# Patient Record
Sex: Male | Born: 1965 | Race: White | Hispanic: No
Health system: Southern US, Community
[De-identification: ages and names within clinical notes are randomized; demographics above are authoritative.]

---

## 2006-02-08 ENCOUNTER — Encounter: Admission: RE | Admit: 2006-02-08 | Discharge: 2006-02-08 | Payer: Self-pay | Admitting: Specialist

## 2008-04-23 ENCOUNTER — Ambulatory Visit (HOSPITAL_BASED_OUTPATIENT_CLINIC_OR_DEPARTMENT_OTHER): Admission: RE | Admit: 2008-04-23 | Discharge: 2008-04-23 | Payer: Self-pay | Admitting: Specialist

## 2008-04-27 IMAGING — US US EXTREM LOW VENOUS*L*
1 series · 14 of 24 positions shown · non-contrast
Comparison: None.

CLINICAL DATA: Left knee pain and swelling post arthroscopy.
 LEFT LOWER EXTREMITY VENOUS IMAGING:
TECHNIQUE: Gray scale sonography with compression, as well as color and duplex Doppler ultrasound, were performed to evaluate the deep venous system from the level of the common femoral vein through the popliteal and proximal calf veins.

[Series 1: unknown · 14 of 26 slices shown]
[im 1/26]
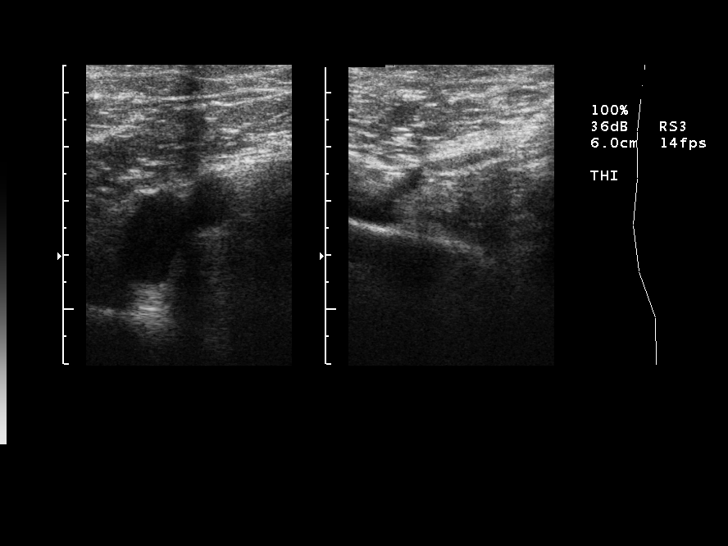
[im 3/26]
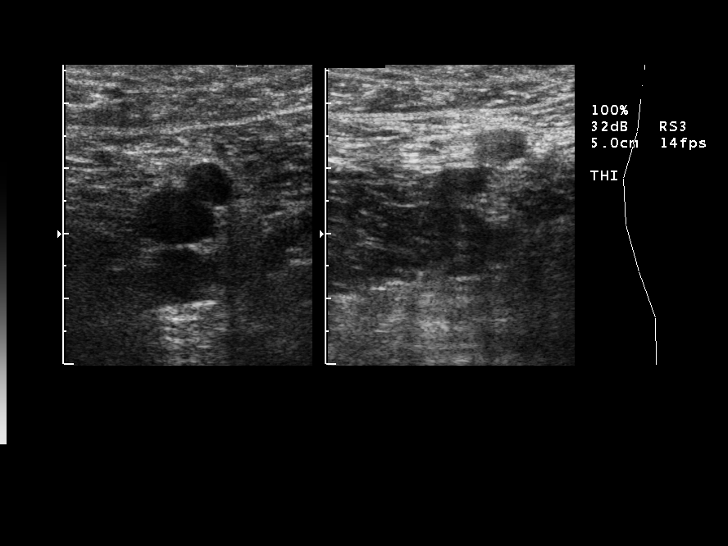
[im 5/26]
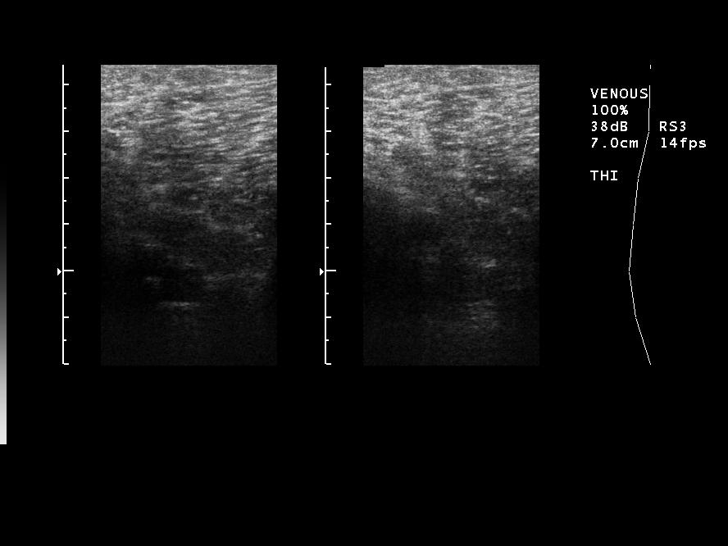
[im 7/26]
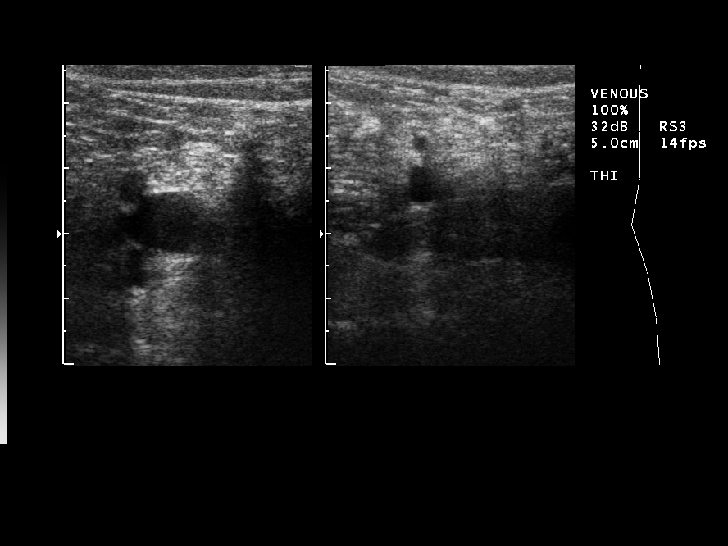
[im 8/26]
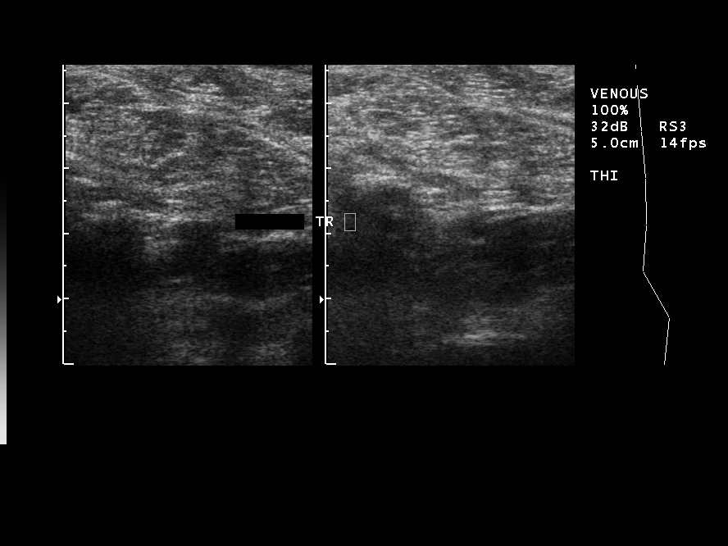
[im 10/26]
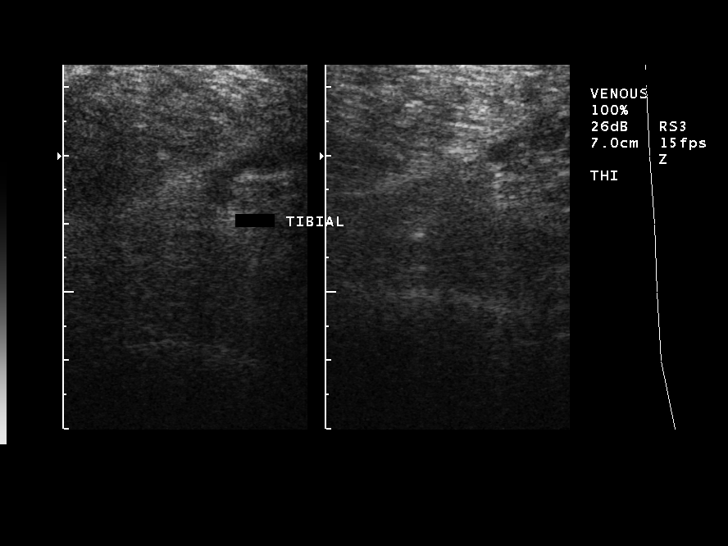
[im 12/26]
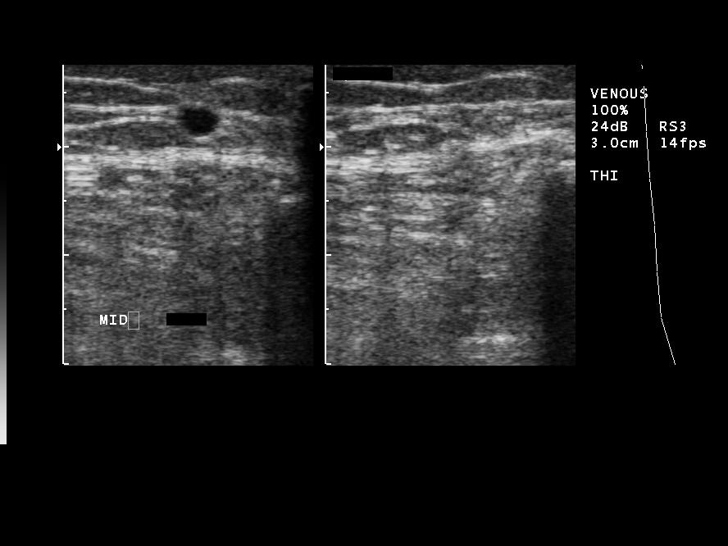
[im 14/26]
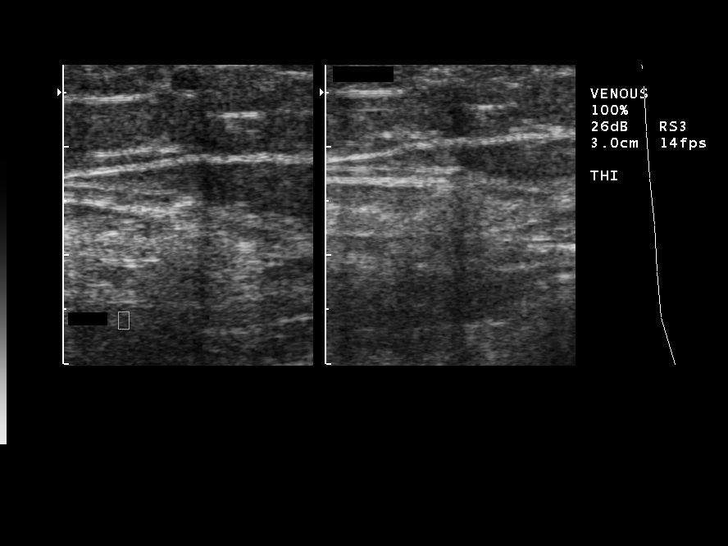
[im 16/26]
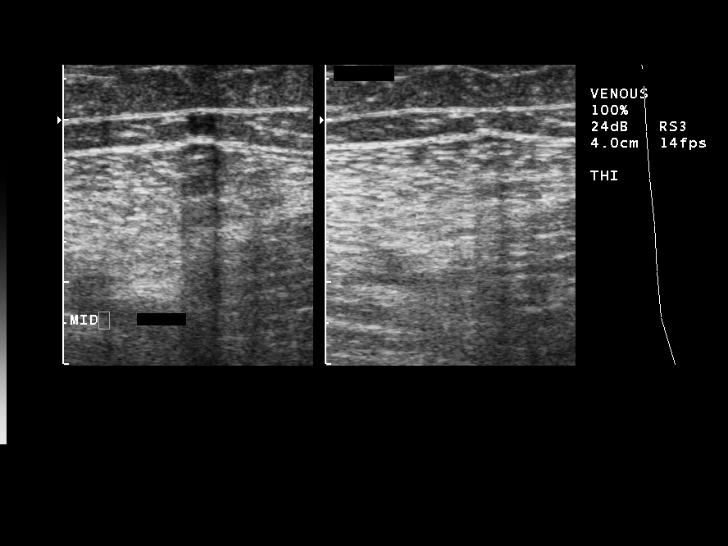
[im 18/26]
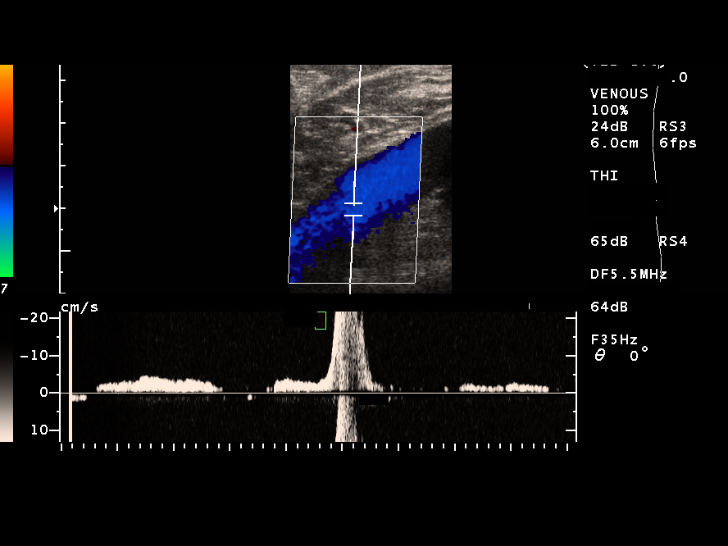
[im 20/26]
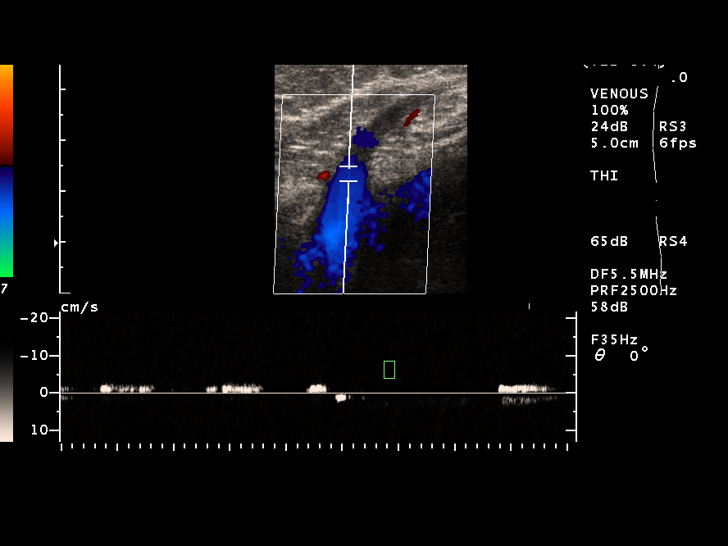
[im 21/26]
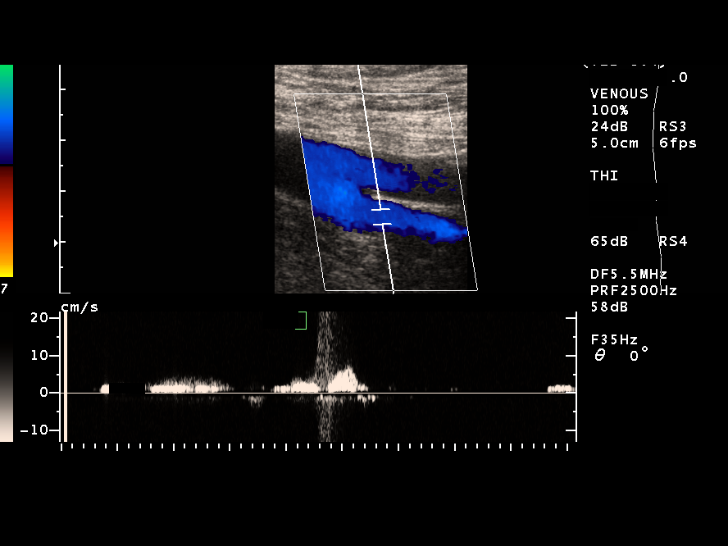
[im 23/26]
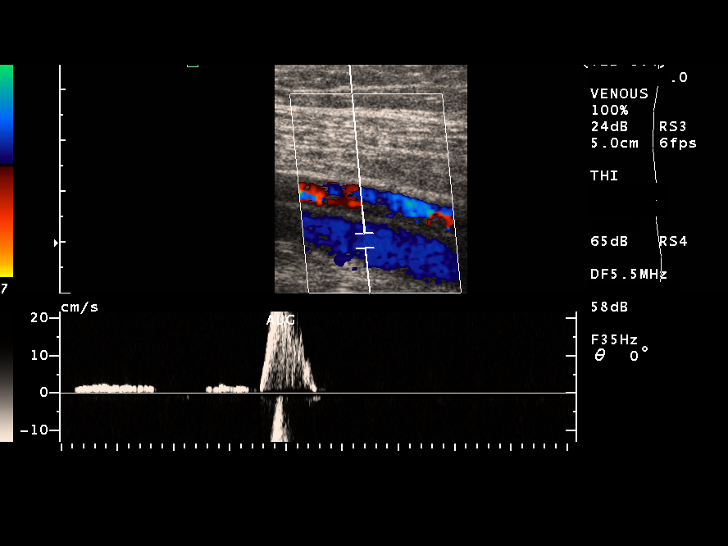
[im 26/26]
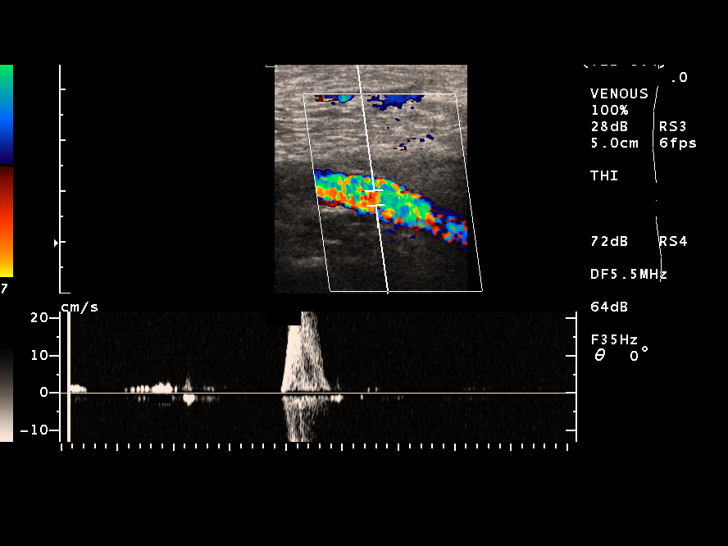

[14 of 24 positions shown; findings below may reference images not displayed]

FINDINGS: The left lower extremity venous system was examined from the groin to the upper calf.   None of the deep veins show evidence for acute thrombosis.  Normal compressibility, phasicity and augmentation is demonstrated.  There is no evidence for clot in the greater saphenous vein either.
IMPRESSION: No sonographic evidence for deep venous thrombosis of the left lower extremity.

## 2010-04-30 LAB — POCT HEMOGLOBIN-HEMACUE: Hemoglobin: 15.5 g/dL (ref 13.0–17.0)

## 2010-06-03 NOTE — Op Note (Signed)
NAME:  Benjamin Barajas, Benjamin Barajas NO.:  [id number]   MEDICAL RECORD NO.:  [id number]          PATIENT TYPE:  AMB   LOCATION:  [facility]                         FACILITY:  [facility]   PHYSICIAN:  Jene Every, M.D.    DATE OF BIRTH:  1965/11/28   DATE OF PROCEDURE:  04/23/2008  DATE OF DISCHARGE:                               OPERATIVE REPORT   PREOPERATIVE DIAGNOSIS:  Post-traumatic chondromalacia of the left knee.   POSTOPERATIVE DIAGNOSES:  1. Post-traumatic chondromalacia of the left knee.  2. Anterior cruciate ligament partial tear.  3. Lateral meniscus partial tear.   PROCEDURE PERFORMED:  Left knee arthroscopy, chondroplasty of the  patella and the femoral sulcus, debridement of anterior cruciate  ligament and lateral meniscus.   BRIEF HISTORY:  A 45 year old status post knee injury, fell, contusion  of the knee.  He had persistent retropatellar pain in the knee and was  indicated for arthroscopic evaluation and debridement.  The risks and  benefits discussed, including bleeding, infection, DVT, no change in  symptoms, worsening symptoms, need for repeat debridement, etc.   DESCRIPTION OF PROCEDURE:  With the patient in the supine position after  induction of adequate general anesthesia, 1 gram of Kefzol, the left  lower extremity was prepped and draped in the usual sterile fashion.  A  lateral parapatellar portal and superomedial parapatellar was fashioned  with a #11 blade.  Ingress cannula atraumatically placed.  Irrigant was  utilized to insufflate the joint.  Under direct visualization, a medial  parapatellar portal was fashioned with a #11 blade after localization  with an 18 gauge needle, sparing the medial meniscus.  There was grade 3  changes of the femoral sulcus.  Some minor grade 3 changes of the  patella with normal patellofemoral tracking.  A shaver was introduced  and utilized to perform a chondroplasty of the sulcus.  There was loose  flap of debris noted.   Some cartilaginous debris in the joint.  This was  evacuated.  Partial fraying of the ACL was noted to on the anterolateral  bundle, however, under anesthesia the knee was stable.  I performed an  anterior Drawer and a good attachment to the notch wall.  Perhaps 20% of  the tendon was involved.  There was also a tear on the posterior horn of  the lateral meniscus and this was shaved to a stable base as well.  The  remainder of the lateral meniscus, femoral condyle and tibial plateau  was remarkable only for some minor grade 2 of the tibial plateau.   Medially, there was normal femoral condyle, tibial plateau and meniscus  stable to probe palpation without evidence of tear.  The gutters were  unremarkable, as well there was no plica.  The knee was copiously  lavaged.  The PCL was unremarkable as well.   All instrumentation was removed.  The portals were closed with 4-0 nylon  simple sutures.  Quarter-percent Marcaine with epinephrine was  infiltrated in the joint.  The wound was dressed sterilely.  He was  awakened without difficulty and transported to the recovery room  in  satisfactory addition.   The patient tolerated the procedure well with no complications.      Jene Every, M.D.  Electronically Signed     JB/MEDQ  D:  04/23/2008  T:  04/23/2008  Job:  [id number]

## 2019-10-27 ENCOUNTER — Encounter: Payer: Self-pay | Admitting: Podiatry

## 2019-10-27 ENCOUNTER — Other Ambulatory Visit: Payer: Self-pay

## 2019-10-27 ENCOUNTER — Ambulatory Visit (INDEPENDENT_AMBULATORY_CARE_PROVIDER_SITE_OTHER): Payer: 59

## 2019-10-27 ENCOUNTER — Ambulatory Visit (INDEPENDENT_AMBULATORY_CARE_PROVIDER_SITE_OTHER): Payer: 59 | Admitting: Podiatry

## 2019-10-27 DIAGNOSIS — M779 Enthesopathy, unspecified: Secondary | ICD-10-CM

## 2019-10-27 DIAGNOSIS — M79672 Pain in left foot: Secondary | ICD-10-CM

## 2019-10-27 DIAGNOSIS — S99922A Unspecified injury of left foot, initial encounter: Secondary | ICD-10-CM

## 2019-10-27 DIAGNOSIS — M778 Other enthesopathies, not elsewhere classified: Secondary | ICD-10-CM

## 2019-10-27 DIAGNOSIS — M25475 Effusion, left foot: Secondary | ICD-10-CM

## 2019-10-27 NOTE — Progress Notes (Signed)
Subjective:   Patient ID: Benjamin Barajas, male   DOB: 54 y.o.   MRN: [id number]   HPI 54 year old male presents today for same-day appointment given left foot pain pointing the fifth metatarsal base.  He states that it started yesterday is been getting worse.  Denies any recent injury or trauma.  He has no history of gout.  He said no recent treatment.  He has a history of plantar fasciitis and EPF surgery.  No radiating pain or weakness.  He has no other concerns today.   Review of Systems  All other systems reviewed and are negative.  History reviewed. No pertinent past medical history.  History reviewed. No pertinent surgical history.  No current outpatient medications on file.  No Known Allergies       Objective:  Physical Exam  General: AAO x3, NAD  Dermatological: Skin is warm, dry and supple bilateral.  There are no open sores, no preulcerative lesions, no rash or signs of infection present.  Vascular: Dorsalis Pedis artery and Posterior Tibial artery pedal pulses are 2/4 bilateral with immedate capillary fill time. There is no pain with calf compression, swelling, warmth, erythema.   Neruologic: Grossly intact via light touch bilateral.   Musculoskeletal: There is tenderness of the fifth metatarsal base on the distal portion of the peroneal tendon as well as laterally the fifth metatarsal base.  There is localized edema to this area there is no erythema or warmth.  Peroneal tendon appears to be intact.  No other areas of discomfort identified today.  Muscular strength 5/5 in all groups tested bilateral.  Gait: Unassisted, Nonantalgic.       Assessment:   Fifth metatarsal base pain, insertional peroneal tendinitis left foot.    Plan:  -Treatment options discussed including all alternatives, risks, and complications -Etiology of symptoms were discussed -X-rays were obtained and reviewed with the patient.  There is no evidence of acute fracture or stress fracture.   Awaiting radiology report. -Steroid injection performed.  Skin was closed alcohol and a mixture of half cc Marcaine plain, half cc of lidocaine plain was infiltrated on the fifth metatarsal base with care if not correctly infiltrated into the peroneal tendon. -Cam boot was discussed.  He continues Percocet as needed for the start to feel better weightbearing in the cam boot. -Voltaren gel.  Anti-inflammatories as needed. -If no improvement next couple days will do Medrol Dosepak  Vivi Barrack DPM

## 2019-10-30 ENCOUNTER — Ambulatory Visit: Payer: Self-pay | Admitting: Podiatry

## 2019-11-10 ENCOUNTER — Ambulatory Visit: Payer: Self-pay | Admitting: Podiatry

## 2022-01-13 IMAGING — DX DG FOOT COMPLETE 3+V*L*
3 series · 3 of 3 positions shown · non-contrast
Comparison: None.

CLINICAL DATA: Acute lateral left foot pain. Unable to bear weight.
No injury.

EXAM:
LEFT FOOT - COMPLETE 3+ VIEW

[foot ap]
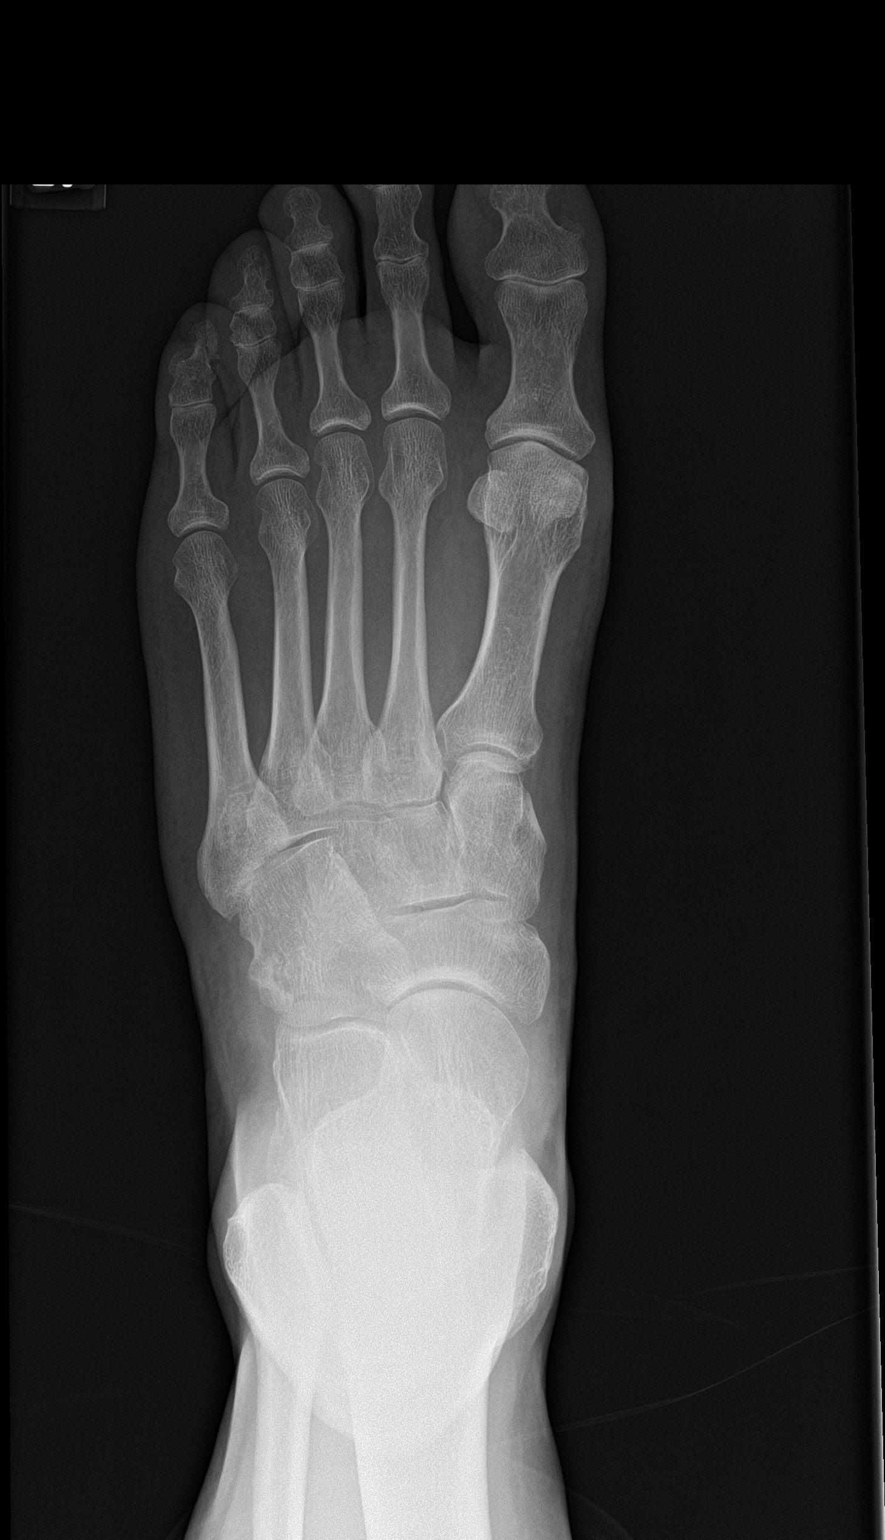

[foot obl]
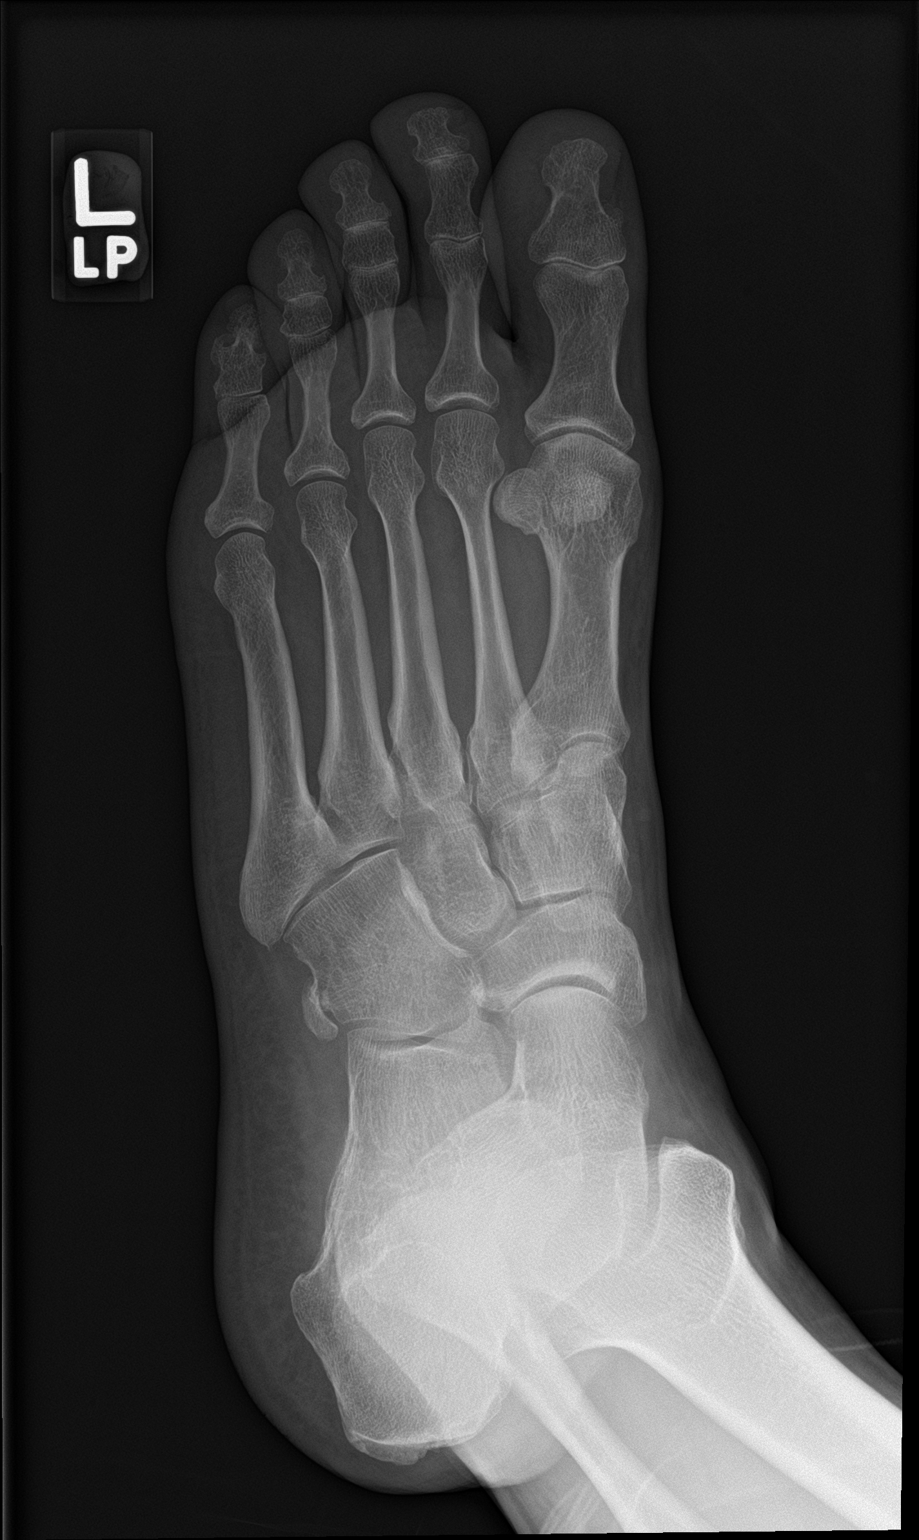

[foot lat]
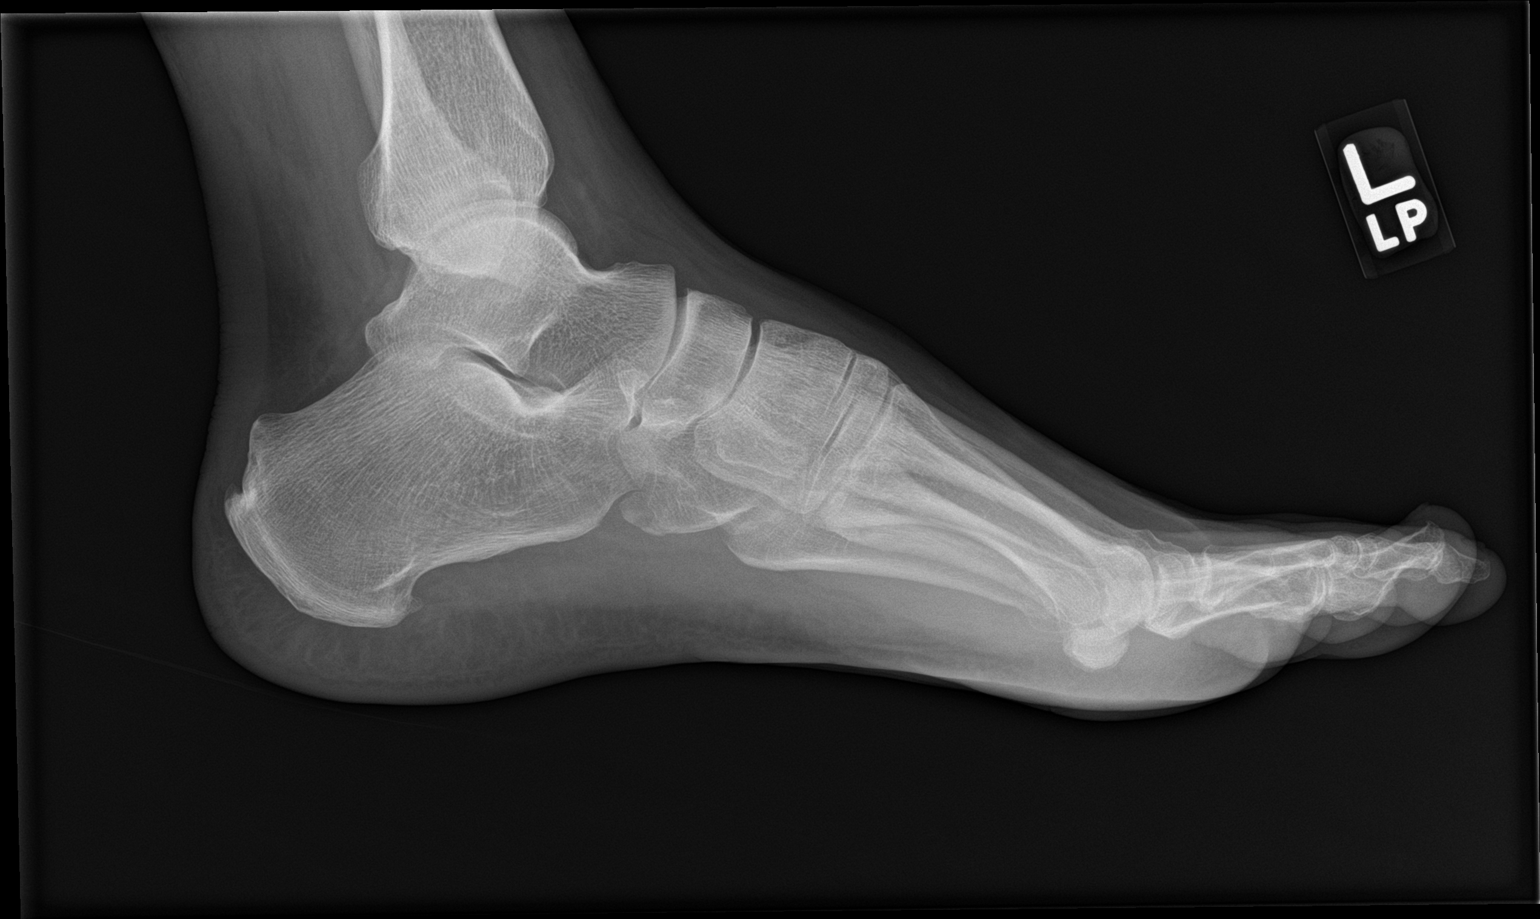

[3 of 3 positions shown; findings below may reference images not displayed]

FINDINGS: There is no evidence of fracture or dislocation. There is no
evidence of arthropathy or other focal bone abnormality. Os
peroneum. Soft tissues are unremarkable.
IMPRESSION: Negative.
